# Patient Record
Sex: Female | Born: 1950
Health system: Southern US, Community
[De-identification: ages and names within clinical notes are randomized; demographics above are authoritative.]

## PROBLEM LIST (undated history)

## (undated) DIAGNOSIS — T7840XA Allergy, unspecified, initial encounter: Secondary | ICD-10-CM

## (undated) DIAGNOSIS — Z789 Other specified health status: Secondary | ICD-10-CM

## (undated) DIAGNOSIS — Z8619 Personal history of other infectious and parasitic diseases: Secondary | ICD-10-CM

## (undated) DIAGNOSIS — B019 Varicella without complication: Secondary | ICD-10-CM

## (undated) HISTORY — PX: LASIK: SHX215

## (undated) HISTORY — DX: Other specified health status: Z78.9

## (undated) HISTORY — DX: Allergy, unspecified, initial encounter: T78.40XA

## (undated) HISTORY — PX: TUBAL LIGATION: SHX77

## (undated) HISTORY — DX: Varicella without complication: B01.9

## (undated) HISTORY — PX: AUGMENTATION MAMMAPLASTY: SUR837

## (undated) HISTORY — DX: Personal history of other infectious and parasitic diseases: Z86.19

---

## 2015-07-26 LAB — LIPID PANEL
Cholesterol: 170 (ref 0–200)
HDL: 68 (ref 35–70)
LDL Cholesterol: 90
LDL/HDL RATIO: 1.3
Triglycerides: 58 (ref 40–160)

## 2015-07-26 LAB — CBC AND DIFFERENTIAL
HCT: 44 (ref 36–46)
HEMOGLOBIN: 14.5 (ref 12.0–16.0)
Neutrophils Absolute: 5
PLATELETS: 266 (ref 150–399)
WBC: 8

## 2015-07-26 LAB — BASIC METABOLIC PANEL
BUN: 14 (ref 4–21)
CREATININE: 0.8 (ref 0.5–1.1)
Glucose: 85
POTASSIUM: 4.6 (ref 3.4–5.3)
SODIUM: 142 (ref 137–147)

## 2015-07-26 LAB — HEPATIC FUNCTION PANEL
ALT: 14 (ref 7–35)
AST: 17 (ref 13–35)
Alkaline Phosphatase: 67 (ref 25–125)
BILIRUBIN, TOTAL: 0.3

## 2015-07-26 LAB — VITAMIN D 25 HYDROXY (VIT D DEFICIENCY, FRACTURES): Vit D, 25-Hydroxy: 20.5

## 2017-12-11 ENCOUNTER — Ambulatory Visit (INDEPENDENT_AMBULATORY_CARE_PROVIDER_SITE_OTHER): Payer: PPO | Admitting: Family Medicine

## 2017-12-11 ENCOUNTER — Encounter: Payer: Self-pay | Admitting: Family Medicine

## 2017-12-11 VITALS — BP 149/76 | HR 73 | Temp 98.1°F | Ht 65.0 in | Wt 124.0 lb

## 2017-12-11 DIAGNOSIS — Z7689 Persons encountering health services in other specified circumstances: Secondary | ICD-10-CM | POA: Diagnosis not present

## 2017-12-11 DIAGNOSIS — Z0001 Encounter for general adult medical examination with abnormal findings: Secondary | ICD-10-CM

## 2017-12-11 DIAGNOSIS — Z13220 Encounter for screening for lipoid disorders: Secondary | ICD-10-CM

## 2017-12-11 DIAGNOSIS — Z1211 Encounter for screening for malignant neoplasm of colon: Secondary | ICD-10-CM

## 2017-12-11 DIAGNOSIS — R030 Elevated blood-pressure reading, without diagnosis of hypertension: Secondary | ICD-10-CM | POA: Insufficient documentation

## 2017-12-11 DIAGNOSIS — Z131 Encounter for screening for diabetes mellitus: Secondary | ICD-10-CM

## 2017-12-11 DIAGNOSIS — E2839 Other primary ovarian failure: Secondary | ICD-10-CM

## 2017-12-11 DIAGNOSIS — F17200 Nicotine dependence, unspecified, uncomplicated: Secondary | ICD-10-CM | POA: Diagnosis not present

## 2017-12-11 DIAGNOSIS — Z0000 Encounter for general adult medical examination without abnormal findings: Secondary | ICD-10-CM

## 2017-12-11 DIAGNOSIS — Z1231 Encounter for screening mammogram for malignant neoplasm of breast: Secondary | ICD-10-CM | POA: Diagnosis not present

## 2017-12-11 DIAGNOSIS — Z130 Encounter for screening for diseases of the blood and blood-forming organs and certain disorders involving the immune mechanism: Secondary | ICD-10-CM | POA: Diagnosis not present

## 2017-12-11 DIAGNOSIS — Z1159 Encounter for screening for other viral diseases: Secondary | ICD-10-CM

## 2017-12-11 DIAGNOSIS — Z1239 Encounter for other screening for malignant neoplasm of breast: Secondary | ICD-10-CM

## 2017-12-11 LAB — BASIC METABOLIC PANEL
BUN: 14 mg/dL (ref 6–23)
CHLORIDE: 104 meq/L (ref 96–112)
CO2: 34 meq/L — AB (ref 19–32)
Calcium: 9.5 mg/dL (ref 8.4–10.5)
Creatinine, Ser: 0.8 mg/dL (ref 0.40–1.20)
GFR: 76.25 mL/min (ref >60.00)
Glucose, Bld: 100 mg/dL — ABNORMAL HIGH (ref 70–99)
POTASSIUM: 4.2 meq/L (ref 3.5–5.1)
SODIUM: 142 meq/L (ref 135–145)

## 2017-12-11 LAB — CBC
HCT: 44.9 % (ref 36.0–46.0)
HEMOGLOBIN: 14.8 g/dL (ref 12.0–15.0)
MCHC: 32.9 g/dL (ref 30.0–36.0)
MCV: 95.1 fl (ref 78.0–100.0)
Platelets: 316 10*3/uL (ref 150.0–400.0)
RBC: 4.72 Mil/uL (ref 3.87–5.11)
RDW: 15 % (ref 11.5–15.5)
WBC: 5.4 10*3/uL (ref 4.0–10.5)

## 2017-12-11 LAB — LIPID PANEL
CHOLESTEROL: 166 mg/dL (ref 0–200)
HDL: 77.1 mg/dL (ref >39.00)
LDL Cholesterol: 77 mg/dL (ref 0–99)
NonHDL: 88.45
Total CHOL/HDL Ratio: 2
Triglycerides: 57 mg/dL (ref 0.0–149.0)
VLDL: 11.4 mg/dL (ref 0.0–40.0)

## 2017-12-11 LAB — HEMOGLOBIN A1C: HEMOGLOBIN A1C: 5.9 % (ref 4.6–6.5)

## 2017-12-11 LAB — TSH: TSH: 0.94 u[IU]/mL (ref 0.35–4.50)

## 2017-12-11 NOTE — Patient Instructions (Addendum)
Shingrix, tdap and 2nd pneumonia shot are all due for you. Please call us on the dates you receive your immunization.  Cologuard will be sent to your home and their company will call to arrange.  We will call you with lab results.  Mammogram/bone density center will also call to schedule your screening.    It was a pleasure meeting you!   Health Maintenance, Female Adopting a healthy lifestyle and getting preventive care can go a long way to promote health and wellness. Talk with your health care provider about what schedule of regular examinations is right for you. This is a good chance for you to check in with your provider about disease prevention and staying healthy. In between checkups, there are plenty of things you can do on your own. Experts have done a lot of research about which lifestyle changes and preventive measures are most likely to keep you healthy. Ask your health care provider for more information. Weight and diet Eat a healthy diet  Be sure to include plenty of vegetables, fruits, low-fat dairy products, and lean protein.  Do not eat a lot of foods high in solid fats, added sugars, or salt.  Get regular exercise. This is one of the most important things you can do for your health. ? Most adults should exercise for at least 150 minutes each week. The exercise should increase your heart rate and make you sweat (moderate-intensity exercise). ? Most adults should also do strengthening exercises at least twice a week. This is in addition to the moderate-intensity exercise.  Maintain a healthy weight  Body mass index (BMI) is a measurement that can be used to identify possible weight problems. It estimates body fat based on height and weight. Your health care provider can help determine your BMI and help you achieve or maintain a healthy weight.  For females 51 years of age and older: ? A BMI below 18.5 is considered underweight. ? A BMI of 18.5 to 24.9 is normal. ? A  BMI of 25 to 29.9 is considered overweight. ? A BMI of 30 and above is considered obese.  Watch levels of cholesterol and blood lipids  You should start having your blood tested for lipids and cholesterol at 66 years of age, then have this test every 5 years.  You may need to have your cholesterol levels checked more often if: ? Your lipid or cholesterol levels are high. ? You are older than 66 years of age. ? You are at high risk for heart disease.  Cancer screening Lung Cancer  Lung cancer screening is recommended for adults 67-62 years old who are at high risk for lung cancer because of a history of smoking.  A yearly low-dose CT scan of the lungs is recommended for people who: ? Currently smoke. ? Have quit within the past 15 years. ? Have at least a 30-pack-year history of smoking. A pack year is smoking an average of one pack of cigarettes a day for 1 year.  Yearly screening should continue until it has been 15 years since you quit.  Yearly screening should stop if you develop a health problem that would prevent you from having lung cancer treatment.  Breast Cancer  Practice breast self-awareness. This means understanding how your breasts normally appear and feel.  It also means doing regular breast self-exams. Let your health care provider know about any changes, no matter how small.  If you are in your 20s or 30s, you should have a  clinical breast exam (CBE) by a health care provider every 1-3 years as part of a regular health exam.  If you are 26 or older, have a CBE every year. Also consider having a breast X-ray (mammogram) every year.  If you have a family history of breast cancer, talk to your health care provider about genetic screening.  If you are at high risk for breast cancer, talk to your health care provider about having an MRI and a mammogram every year.  Breast cancer gene (BRCA) assessment is recommended for women who have family members with  BRCA-related cancers. BRCA-related cancers include: ? Breast. ? Ovarian. ? Tubal. ? Peritoneal cancers.  Results of the assessment will determine the need for genetic counseling and BRCA1 and BRCA2 testing.  Cervical Cancer Your health care provider may recommend that you be screened regularly for cancer of the pelvic organs (ovaries, uterus, and vagina). This screening involves a pelvic examination, including checking for microscopic changes to the surface of your cervix (Pap test). You may be encouraged to have this screening done every 3 years, beginning at age 30.  For women ages 20-65, health care providers may recommend pelvic exams and Pap testing every 3 years, or they may recommend the Pap and pelvic exam, combined with testing for human papilloma virus (HPV), every 5 years. Some types of HPV increase your risk of cervical cancer. Testing for HPV may also be done on women of any age with unclear Pap test results.  Other health care providers may not recommend any screening for nonpregnant women who are considered low risk for pelvic cancer and who do not have symptoms. Ask your health care provider if a screening pelvic exam is right for you.  If you have had past treatment for cervical cancer or a condition that could lead to cancer, you need Pap tests and screening for cancer for at least 20 years after your treatment. If Pap tests have been discontinued, your risk factors (such as having a new sexual partner) need to be reassessed to determine if screening should resume. Some women have medical problems that increase the chance of getting cervical cancer. In these cases, your health care provider may recommend more frequent screening and Pap tests.  Colorectal Cancer  This type of cancer can be detected and often prevented.  Routine colorectal cancer screening usually begins at 66 years of age and continues through 66 years of age.  Your health care provider may recommend screening  at an earlier age if you have risk factors for colon cancer.  Your health care provider may also recommend using home test kits to check for hidden blood in the stool.  A small camera at the end of a tube can be used to examine your colon directly (sigmoidoscopy or colonoscopy). This is done to check for the earliest forms of colorectal cancer.  Routine screening usually begins at age 3.  Direct examination of the colon should be repeated every 5-10 years through 66 years of age. However, you may need to be screened more often if early forms of precancerous polyps or small growths are found.  Skin Cancer  Check your skin from head to toe regularly.  Tell your health care provider about any new moles or changes in moles, especially if there is a change in a mole's shape or color.  Also tell your health care provider if you have a mole that is larger than the size of a pencil eraser.  Always use sunscreen.  Apply sunscreen liberally and repeatedly throughout the day.  Protect yourself by wearing long sleeves, pants, a wide-brimmed hat, and sunglasses whenever you are outside.  Heart disease, diabetes, and high blood pressure  High blood pressure causes heart disease and increases the risk of stroke. High blood pressure is more likely to develop in: ? People who have blood pressure in the high end of the normal range (130-139/85-89 mm Hg). ? People who are overweight or obese. ? People who are African American.  If you are 14-33 years of age, have your blood pressure checked every 3-5 years. If you are 62 years of age or older, have your blood pressure checked every year. You should have your blood pressure measured twice-once when you are at a hospital or clinic, and once when you are not at a hospital or clinic. Record the average of the two measurements. To check your blood pressure when you are not at a hospital or clinic, you can use: ? An automated blood pressure machine at a  pharmacy. ? A home blood pressure monitor.  If you are between 3 years and 45 years old, ask your health care provider if you should take aspirin to prevent strokes.  Have regular diabetes screenings. This involves taking a blood sample to check your fasting blood sugar level. ? If you are at a normal weight and have a low risk for diabetes, have this test once every three years after 66 years of age. ? If you are overweight and have a high risk for diabetes, consider being tested at a younger age or more often. Preventing infection Hepatitis B  If you have a higher risk for hepatitis B, you should be screened for this virus. You are considered at high risk for hepatitis B if: ? You were born in a country where hepatitis B is common. Ask your health care provider which countries are considered high risk. ? Your parents were born in a high-risk country, and you have not been immunized against hepatitis B (hepatitis B vaccine). ? You have HIV or AIDS. ? You use needles to inject street drugs. ? You live with someone who has hepatitis B. ? You have had sex with someone who has hepatitis B. ? You get hemodialysis treatment. ? You take certain medicines for conditions, including cancer, organ transplantation, and autoimmune conditions.  Hepatitis C  Blood testing is recommended for: ? Everyone born from [birth year] through 1965. ? Anyone with known risk factors for hepatitis C.  Sexually transmitted infections (STIs)  You should be screened for sexually transmitted infections (STIs) including gonorrhea and chlamydia if: ? You are sexually active and are younger than 66 years of age. ? You are older than 66 years of age and your health care provider tells you that you are at risk for this type of infection. ? Your sexual activity has changed since you were last screened and you are at an increased risk for chlamydia or gonorrhea. Ask your health care provider if you are at risk.  If you do not  have HIV, but are at risk, it may be recommended that you take a prescription medicine daily to prevent HIV infection. This is called pre-exposure prophylaxis (PrEP). You are considered at risk if: ? You are sexually active and do not regularly use condoms or know the HIV status of your partner(s). ? You take drugs by injection. ? You are sexually active with a partner who has HIV.  Talk with your health care  provider about whether you are at high risk of being infected with HIV. If you choose to begin PrEP, you should first be tested for HIV. You should then be tested every 3 months for as long as you are taking PrEP. Pregnancy  If you are premenopausal and you may become pregnant, ask your health care provider about preconception counseling.  If you may become pregnant, take 400 to 800 micrograms (mcg) of folic acid every day.  If you want to prevent pregnancy, talk to your health care provider about birth control (contraception). Osteoporosis and menopause  Osteoporosis is a disease in which the bones lose minerals and strength with aging. This can result in serious bone fractures. Your risk for osteoporosis can be identified using a bone density scan.  If you are 45 years of age or older, or if you are at risk for osteoporosis and fractures, ask your health care provider if you should be screened.  Ask your health care provider whether you should take a calcium or vitamin D supplement to lower your risk for osteoporosis.  Menopause may have certain physical symptoms and risks.  Hormone replacement therapy may reduce some of these symptoms and risks. Talk to your health care provider about whether hormone replacement therapy is right for you. Follow these instructions at home:  Schedule regular health, dental, and eye exams.  Stay current with your immunizations.  Do not use any tobacco products including cigarettes, chewing tobacco, or electronic cigarettes.  If you are pregnant,  do not drink alcohol.  If you are breastfeeding, limit how much and how often you drink alcohol.  Limit alcohol intake to no more than 1 drink per day for nonpregnant women. One drink equals 12 ounces of beer, 5 ounces of wine, or 1 ounces of hard liquor.  Do not use street drugs.  Do not share needles.  Ask your health care provider for help if you need support or information about quitting drugs.  Tell your health care provider if you often feel depressed.  Tell your health care provider if you have ever been abused or do not feel safe at home. This information is not intended to replace advice given to you by your health care provider. Make sure you discuss any questions you have with your health care provider. Document Released: 06/30/2011 Document Revised: 05/22/2016 Document Reviewed: 09/18/2015 Elsevier Interactive Patient Education  [employer].

## 2017-12-11 NOTE — Progress Notes (Signed)
Patient ID: Leah Hansen, female  DOB: 01-Aug-1951, 66 y.o.   MRN: [id number] Patient Care Team    Relationship Specialty Notifications Start End  Ma Hillock, DO PCP - General Family Medicine  12/11/17     Chief Complaint  Patient presents with  . Establish Care    Subjective:  Leah Hansen is a 66 y.o.  female present for new patient establishment. All past medical history, surgical history, allergies, family history, immunizations, medications and social history were updated in the electronic medical record today. All recent labs, ED visits and hospitalizations within the last year were reviewed.  Tobacco use: Patient reports she has smoked approximately 40 years. Initially she smoked about 1 pack per day of pall mall lites. Currently she smokes about half a pack a day and less under great stress and then it can get up near 1 pack per day.  Health maintenance:  Colonoscopy: never screened, no fhx. Declines colonoscopy but is agreeable to cologuard.  Mammogram: completed: Patient reports a normal mammogram in 2005. She is agreeable to have mammogram completed. No breast cancer in the family. Would like to have near [place] or [place] if possible. Cervical cancer screening: N/A > 65 Immunizations: tdap patient reports having immunization completed through [facility] in [place], [place] UTD 2018 (encouraged yearly), PNA series - patient reports having 1 vaccination against pneumonia however she is uncertain which vaccination she will check with [facility] in [place], she will think about the shingrix vaccine and get through work Geneticist, molecular) Infectious disease screening:  Hep C agreeable to testing will try to add on labs.  DEXA: Never screened. Will complete with mammogram.  Assistive device: None Oxygen NWG:NFAO Patient has a Dental home. Hospitalizations/ED visits: reviewed if available. No prior PCP records available at time of visit.   Depression  screen PHQ 2/9 12/11/2017  Decreased Interest 0  Down, Depressed, Hopeless 0  PHQ - 2 Score 0   No flowsheet data found.    Current Exercise Habits: Home exercise routine, Type of exercise: strength training/weights, Time (Minutes): > 60(90), Frequency (Times/Week): 4, Weekly Exercise (Minutes/Week): 0, Intensity: Moderate   Fall Risk  12/11/2017  Falls in the past year? No  Risk for fall due to : Other (Comment)     Immunization History  Administered Date(s) Administered  . Influenza-Unspecified 11/03/2017    No exam data present  Past Medical History:  Diagnosis Date  . Allergy   . Chicken pox    Not on File Past Surgical History:  Procedure Laterality Date  . BREAST SURGERY     augmentation  . TUBAL LIGATION     Family History  Problem Relation Age of Onset  . Asthma Mother   . COPD Mother   . Hearing loss Mother   . Hyperlipidemia Mother   . Alcohol abuse Father   . Arthritis Father   . Early death Father   . Heart disease Father   . Hyperlipidemia Father   . Hypertension Father   . Alcohol abuse Brother   . Lung cancer Brother   . Depression Brother   . Drug abuse Brother   . Early death Brother   . Arthritis Paternal Grandmother   . Lymphoma Paternal Grandmother   . COPD Paternal Grandfather   . Alcohol abuse Brother   . Drug abuse Brother   . Early death Brother   . Alcohol abuse Son   . COPD Son   . Drug abuse Son  Social History   Socioeconomic History  . Marital status: Divorced    Spouse name: Not on file  . Number of children: 3  . Years of education: Not on file  . Highest education level: Not on file  Social Needs  . Financial resource strain: Not on file  . Food insecurity - worry: Not on file  . Food insecurity - inability: Not on file  . Transportation needs - medical: Not on file  . Transportation needs - non-medical: Not on file  Occupational History  . Occupation: Occupational psychologist  Tobacco Use  . Smoking status:  Current Every Day Smoker    Packs/day: 1.00    Years: 40.00    Pack years: 40.00    Types: Cigarettes  . Smokeless tobacco: Never Used  Substance and Sexual Activity  . Alcohol use: No    Frequency: Never  . Drug use: No  . Sexual activity: No  Other Topics Concern  . Not on file  Social History Narrative   Divorced, college educated, 3 children.   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   Current smoker.   Exercises routinely.   Takes a daily vitamin.   Drinks caffeine.   Has a smoke alarm in the home, wears her seatbelt.   Wears dentures.   Feels safe in her relationships.   Allergies as of 12/11/2017   Not on File     Medication List        Accurate as of 12/11/17  5:43 PM. Always use your most recent med list.          CALCIUM 600+D PLUS MINERALS 600-400 MG-UNIT Tabs Take 1 tablet by mouth daily.   CoQ-10 100 MG Caps Take 1 g by mouth daily.   LUTEIN PO Take 1 capsule by mouth daily.   selenium 50 MCG Tabs tablet Take 50 mcg by mouth daily.   TURMERIC PO Take 2 tablets by mouth daily.   vitamin C 1000 MG tablet Take 1,000 mg by mouth daily.       All past medical history, surgical history, allergies, family history, immunizations andmedications were updated in the EMR today and reviewed under the history and medication portions of their EMR.    Recent Results (from the past 2160 hour(s))  CBC     Status: None   Collection Time: 12/11/17 10:43 AM  Result Value Ref Range   WBC 5.4 4.0 - 10.5 K/uL   RBC 4.72 3.87 - 5.11 Mil/uL   Platelets 316.0 150.0 - 400.0 K/uL   Hemoglobin 14.8 12.0 - 15.0 g/dL   HCT 44.9 36.0 - 46.0 %   MCV 95.1 78.0 - 100.0 fl   MCHC 32.9 30.0 - 36.0 g/dL   RDW 15.0 11.5 - 87.5 %  Basic Metabolic Panel (BMET)     Status: Abnormal   Collection Time: 12/11/17 10:43 AM  Result Value Ref Range   Sodium 142 135 - 145 mEq/L   Potassium 4.2 3.5 - 5.1 mEq/L   Chloride 104 96 - 112 mEq/L   CO2 34 (H) 19 - 32  mEq/L   Glucose, Bld 100 (H) 70 - 99 mg/dL   BUN 14 6 - 23 mg/dL   Creatinine, Ser 0.80 0.40 - 1.20 mg/dL   Calcium 9.5 8.4 - 10.5 mg/dL   GFR 76.25 >60.00 mL/min  TSH     Status: None   Collection Time: 12/11/17 10:43 AM  Result Value Ref Range   TSH  0.94 0.35 - 4.50 uIU/mL  HgB A1c     Status: None   Collection Time: 12/11/17 10:43 AM  Result Value Ref Range   Hgb A1c MFr Bld 5.9 4.6 - 6.5 %    Comment: Glycemic Control Guidelines for People with Diabetes:Non Diabetic:  <6%Goal of Therapy: <7%Additional Action Suggested:  >8%   Lipid panel     Status: None   Collection Time: 12/11/17 10:43 AM  Result Value Ref Range   Cholesterol 166 0 - 200 mg/dL    Comment: ATP III Classification       Desirable:  < 200 mg/dL               Borderline High:  200 - 239 mg/dL          High:  > = 240 mg/dL   Triglycerides 57.0 0.0 - 149.0 mg/dL    Comment: Normal:  <150 mg/dLBorderline High:  150 - 199 mg/dL   HDL 77.10 >39.00 mg/dL   VLDL 11.4 0.0 - 40.0 mg/dL   LDL Cholesterol 77 0 - 99 mg/dL   Total CHOL/HDL Ratio 2     Comment:                Men          Women1/2 Average Risk     3.4          3.3Average Risk          5.0          4.42X Average Risk          9.6          7.13X Average Risk          15.0          11.0                       NonHDL 88.45     Comment: NOTE:  Non-HDL goal should be 30 mg/dL higher than patient's LDL goal (i.e. LDL goal of < 70 mg/dL, would have non-HDL goal of < 100 mg/dL)    Patient was never admitted.   ROS: 14 pt review of systems performed and negative (unless mentioned in an HPI)  Objective: BP (!) 149/76 (BP Location: Left Arm, Patient Position: Sitting, Cuff Size: Normal)   Pulse 73   Temp 98.1 F (36.7 C)   Ht 5\' 5"  (1.651 m)   Wt 124 lb (56.2 kg)   SpO2 97%   BMI 20.63 kg/m  Gen: Afebrile. No acute distress. Nontoxic in appearance, well-developed, well-nourished,  thin Caucasian female. HENT: AT. [place]. Bilateral TM visualized and normal in  appearance, normal external auditory canal. MMM, no oral lesions, adequate dentition. Bilateral nares within normal limits. Throat without erythema, ulcerations or exudates. no Cough on exam, no hoarseness on exam. Eyes:Pupils Equal Round Reactive to light, Extraocular movements intact,  Conjunctiva without redness, discharge or icterus. Neck/lymp/endocrine: Supple, no lymphadenopathy, no thyromegaly CV: RRR no murmur, no edema, +2/4 P posterior tibialis pulses. No carotid bruits. No JVD. Chest: CTAB, no wheeze, rhonchi or crackles. Normal Respiratory effort. Good Air movement. Abd: Soft. Flat. NTND. BS present. No Masses palpated. No hepatosplenomegaly. No rebound tenderness or guarding. Skin: No rashes, purpura or petechiae. Warm and well-perfused. Skin intact. Neuro/Msk:  Normal gait. PERLA. EOMi. Alert. Oriented x3.  Cranial nerves II through XII intact. Muscle strength 5/5 upper/lower extremity. DTRs equal bilaterally. Psych: Normal affect, dress and demeanor. Normal speech.  Normal thought content and judgment.   Assessment/plan: MILICA GULLY is a 66 y.o. female present for establish care. Elevated blood pressure reading - Discussed blood pressure elevation with patient today. She is nervous being she is establishing with a physician and she hasn't really seen a physician routinely in many years. She works at [employer], I have asked her to monitor her blood pressure over the next few weeks, if greater than 140 routinely on the top and we would need to see her back sooner. - Low-sodium diet. Increase exercise. - CBC - Basic Metabolic Panel (BMET) - TSH - HgB A1c - Lipid panel  Screening cholesterol level - Lipid panel Diabetes mellitus screening - HgB A1c Screening for iron deficiency anemia - CBC Need for hepatitis C screening test - Hepatitis C Antibody Estrogen deficiency - complete with mammogram - DG Bone Density; Future Breast cancer screening - complete with DEXA -  MM DIGITAL SCREENING BILATERAL; Future Colon cancer screening Cologuard ordered. Adamantly declines colonoscopy unless positive Colocort or issue. Tobacco use disorder Briefly discussed low-dose CT lung cancer screening with patient today. She has not been established with a primary care in a very long time, and has agreed to many screenings already today. Do not want to overwhelm her, and she appreciates this. She will think about the low-dose CT and is agreeable will make an appointment to discuss or we can order at next years physical. - Patient was encouraged to stop smoking, if she needs assistance with this or interested in prescribed medications she will make an appointment to be seen.  Encounter for preventative adult health care examination/establish care Patient was encouraged to exercise greater than 150 minutes a week. Patient was encouraged to choose a diet filled with fresh fruits and vegetables, and Leah meats. AVS provided to patient today for education/recommendation on gender specific health and safety maintenance. Immunizations: Patient reports receiving tetanus and a pneumonia vaccination through her work at [employer]. She will check on the dates for these and report back to [place]. Discussed with her she should have the second pneumonia vaccination in the series, however I want to see when in what vaccination for pneumonia she has artery received. Encourage her to have the shingles vaccination through her employment if available.Flu shot up-to-date 2018. Mammogram in bone density screening ordered. Cologuard ordered. Hep C screen ordered.   Follow-up one year for CPE and less labs and imaging indicate she needs to be seen her for follow-up.  Note is dictated utilizing voice recognition software. Although note has been proof read prior to signing, occasional typographical errors still can be missed. If any questions arise, please do not hesitate to call for verification.    Electronically signed by: Howard Pouch, DO [place]

## 2017-12-14 ENCOUNTER — Telehealth: Payer: Self-pay | Admitting: Family Medicine

## 2017-12-14 ENCOUNTER — Other Ambulatory Visit: Payer: PPO

## 2017-12-14 ENCOUNTER — Encounter: Payer: Self-pay | Admitting: *Deleted

## 2017-12-14 DIAGNOSIS — R7309 Other abnormal glucose: Secondary | ICD-10-CM | POA: Insufficient documentation

## 2017-12-14 DIAGNOSIS — Z1159 Encounter for screening for other viral diseases: Secondary | ICD-10-CM | POA: Diagnosis not present

## 2017-12-14 NOTE — Telephone Encounter (Signed)
Left detailed message with results and instructions on patient voice mail and sent in My Chart.

## 2017-12-14 NOTE — Addendum Note (Signed)
Addended by: Leota Jacobsen on: 12/14/2017 08:04 AM   Modules accepted: Orders

## 2017-12-14 NOTE — Telephone Encounter (Signed)
Please call pt: - Her labs all look good. Her cholesterol looked great. Thyroid functioning normal. - Her diabetes screen is just very mildly elevated in the prediabetic range at 5.9, but her fasting glucose was good. Given this I would encourage her to attempt to lower sugar diet and make sure to exercise greater than 150 minutes a week.  -Her blood pressure was mildly elevated during her exam. 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 If her blood pressure is greater than 140 on the top number or 90 on the bottom lumbar routinely, then we would need to see her to discuss blood pressure medication.  - Follow-up in 6 months- provider appointment for repeat blood pressure check in the office and repeat A1c for elevated A1c.

## 2017-12-15 LAB — HEPATITIS C ANTIBODY
Hepatitis C Ab: NONREACTIVE
SIGNAL TO CUT-OFF: 0.01 (ref <1.00)

## 2017-12-30 ENCOUNTER — Ambulatory Visit (INDEPENDENT_AMBULATORY_CARE_PROVIDER_SITE_OTHER): Payer: PPO

## 2017-12-30 ENCOUNTER — Other Ambulatory Visit: Payer: Self-pay | Admitting: Family Medicine

## 2017-12-30 DIAGNOSIS — M85852 Other specified disorders of bone density and structure, left thigh: Secondary | ICD-10-CM

## 2017-12-30 DIAGNOSIS — Z1231 Encounter for screening mammogram for malignant neoplasm of breast: Secondary | ICD-10-CM

## 2017-12-30 DIAGNOSIS — Z1239 Encounter for other screening for malignant neoplasm of breast: Secondary | ICD-10-CM

## 2017-12-30 DIAGNOSIS — E2839 Other primary ovarian failure: Secondary | ICD-10-CM

## 2017-12-30 DIAGNOSIS — Z1212 Encounter for screening for malignant neoplasm of rectum: Secondary | ICD-10-CM | POA: Diagnosis not present

## 2017-12-30 DIAGNOSIS — Z1211 Encounter for screening for malignant neoplasm of colon: Secondary | ICD-10-CM | POA: Diagnosis not present

## 2017-12-30 DIAGNOSIS — Z780 Asymptomatic menopausal state: Secondary | ICD-10-CM | POA: Diagnosis not present

## 2018-01-01 LAB — COLOGUARD: Cologuard: POSITIVE

## 2018-01-07 ENCOUNTER — Encounter: Payer: Self-pay | Admitting: *Deleted

## 2018-01-07 ENCOUNTER — Telehealth: Payer: Self-pay | Admitting: *Deleted

## 2018-01-07 NOTE — Telephone Encounter (Signed)
Results of cologuard done 12/31/17 came back with positive result. Please advise.

## 2018-01-07 NOTE — Telephone Encounter (Signed)
Left message for patient to return call.

## 2018-01-07 NOTE — Telephone Encounter (Signed)
Please call pt and inform her the Cologuard test is positive.  A positive result does not necessarily mean that she has cancer. It means that Cologuard detected DNA and/or  biomarkers in the stool which are associated with colon cancer or precancer. Patients with a positive result should have a diagnostic colonoscopy. Not investigating further now, could result in missed opportunity to diagnose and treat colon cancer or precancer. She adamantly refused colonoscopy referral during her preventive, unless her Cologuard was positive. I am recommending she have a colonoscopy now that it is positive. I will place a referral to the gastroenterologist if she is agreeable to see them.

## 2018-01-08 ENCOUNTER — Encounter: Payer: Self-pay | Admitting: *Deleted

## 2018-01-08 DIAGNOSIS — R195 Other fecal abnormalities: Secondary | ICD-10-CM | POA: Insufficient documentation

## 2018-01-08 NOTE — Telephone Encounter (Signed)
Sent information in [facility] Chart as requested by patient.

## 2018-01-08 NOTE — Telephone Encounter (Signed)
Patient sent My Chart message she is willing to see GI

## 2018-01-15 ENCOUNTER — Encounter: Payer: Self-pay | Admitting: Internal Medicine

## 2018-01-18 ENCOUNTER — Encounter: Payer: Self-pay | Admitting: Family Medicine

## 2018-01-21 ENCOUNTER — Encounter: Payer: Self-pay | Admitting: Family Medicine

## 2018-01-21 ENCOUNTER — Encounter: Payer: Self-pay | Admitting: *Deleted

## 2018-01-21 DIAGNOSIS — E559 Vitamin D deficiency, unspecified: Secondary | ICD-10-CM | POA: Insufficient documentation

## 2018-03-11 ENCOUNTER — Ambulatory Visit (AMBULATORY_SURGERY_CENTER): Payer: Self-pay

## 2018-03-11 ENCOUNTER — Encounter: Payer: Self-pay | Admitting: Internal Medicine

## 2018-03-11 VITALS — Ht 65.0 in | Wt 125.4 lb

## 2018-03-11 DIAGNOSIS — R195 Other fecal abnormalities: Secondary | ICD-10-CM

## 2018-03-11 MED ORDER — NA SULFATE-K SULFATE-MG SULF 17.5-3.13-1.6 GM/177ML PO SOLN: 1.0000 | Freq: Once | ORAL | 0 refills | Status: AC

## 2018-03-11 NOTE — Progress Notes (Signed)
Per pt, no allergies to soy or egg products.Pt not taking any weight loss meds or using  O2 at home.  Pt refused emmi video. 

## 2018-03-25 ENCOUNTER — Ambulatory Visit (AMBULATORY_SURGERY_CENTER): Payer: BLUE CROSS/BLUE SHIELD | Admitting: Internal Medicine

## 2018-03-25 ENCOUNTER — Other Ambulatory Visit: Payer: Self-pay

## 2018-03-25 ENCOUNTER — Encounter: Payer: Self-pay | Admitting: Internal Medicine

## 2018-03-25 VITALS — BP 102/66 | HR 71 | Temp 98.0°F | Resp 17 | Ht 65.0 in | Wt 125.0 lb

## 2018-03-25 DIAGNOSIS — D122 Benign neoplasm of ascending colon: Secondary | ICD-10-CM

## 2018-03-25 DIAGNOSIS — Z1211 Encounter for screening for malignant neoplasm of colon: Secondary | ICD-10-CM | POA: Diagnosis not present

## 2018-03-25 DIAGNOSIS — R195 Other fecal abnormalities: Secondary | ICD-10-CM

## 2018-03-25 DIAGNOSIS — D120 Benign neoplasm of cecum: Secondary | ICD-10-CM | POA: Diagnosis not present

## 2018-03-25 DIAGNOSIS — D124 Benign neoplasm of descending colon: Secondary | ICD-10-CM

## 2018-03-25 MED ORDER — SODIUM CHLORIDE 0.9 % IV SOLN
500.0000 mL | Freq: Once | INTRAVENOUS | Status: AC
Start: 2018-03-25 — End: ?

## 2018-03-25 NOTE — Progress Notes (Signed)
Report to RN, VSS, adequate respirations noted, no c/o pain or discomfort 

## 2018-03-25 NOTE — Op Note (Signed)
[facility] Patient Name: Leah Hansen Procedure Date: 03/25/2018 9:20 AM MRN: [id number] Endoscopist: Jerene Bears , MD Age: 67 Referring MD:  Date of Birth: 28-May-1951 Gender: Female Account #: [id number] Procedure:                Colonoscopy Indications:              Positive Cologuard test Medicines:                Monitored Anesthesia Care Procedure:                Pre-Anesthesia Assessment:                           - Prior to the procedure, a History and Physical                            was performed, and patient medications and                            allergies were reviewed. The patient's tolerance of                            previous anesthesia was also reviewed. The risks                            and benefits of the procedure and the sedation                            options and risks were discussed with the patient.                            All questions were answered, and informed consent                            was obtained. Prior Anticoagulants: The patient has                            taken no previous anticoagulant or antiplatelet                            agents. ASA Grade Assessment: II - A patient with                            mild systemic disease. After reviewing the risks                            and benefits, the patient was deemed in                            satisfactory condition to undergo the procedure.                           After obtaining informed consent, the colonoscope  was passed under direct vision. Throughout the                            procedure, the patient's blood pressure, pulse, and                            oxygen saturations were monitored continuously. The                            Model PCF-[id number]) scope was introduced                            through the anus and advanced to the the cecum,                            identified by appendiceal orifice  and ileocecal                            valve. The colonoscopy was performed without                            difficulty. The patient tolerated the procedure                            well. The quality of the bowel preparation was                            good. The ileocecal valve, appendiceal orifice, and                            rectum were photographed. Scope In: 9:26:45 AM Scope Out: 9:51:05 AM Scope Withdrawal Time: 0 hours 19 minutes 0 seconds  Total Procedure Duration: 0 hours 24 minutes 20 seconds  Findings:                 The digital rectal exam was normal.                           A 20 mm polyp was found in the cecum. The polyp was                            sessile. The polyp was removed with a piecemeal                            technique using a cold snare. Resection and                            retrieval were complete.                           Two sessile polyps were found in the ascending                            colon. The polyps were 5 to 7 mm in  size. These                            polyps were removed with a cold snare. Resection                            and retrieval were complete.                           A 5 mm polyp was found in the descending colon. The                            polyp was sessile. The polyp was removed with a                            cold snare. Resection and retrieval were complete.                           The retroflexed view of the distal rectum and anal                            verge was normal and showed no anal or rectal                            abnormalities. Complications:            No immediate complications. Estimated Blood Loss:     Estimated blood loss was minimal. Impression:               - One 20 mm polyp in the cecum, removed piecemeal                            using a cold snare. Resected and retrieved.                           - Two 5 to 7 mm polyps in the ascending colon,                             removed with a cold snare. Resected and retrieved.                           - One 5 mm polyp in the descending colon, removed                            with a cold snare. Resected and retrieved.                           - The distal rectum and anal verge are normal on                            retroflexion view. Recommendation:           - Patient has a contact number available for  emergencies. The signs and symptoms of potential                            delayed complications were discussed with the                            patient. Return to normal activities tomorrow.                            Written discharge instructions were provided to the                            patient.                           - Resume previous diet.                           - Continue present medications.                           - Await pathology results.                           - Repeat colonoscopy in 3 years for surveillance. Jerene Bears, MD 03/25/2018 10:04:49 AM This report has been signed electronically.

## 2018-03-25 NOTE — Progress Notes (Signed)
Called to room to assist during endoscopic procedure.  Patient ID and intended procedure confirmed with present staff. Received instructions for my participation in the procedure from the performing physician.  

## 2018-03-25 NOTE — Patient Instructions (Signed)
Information given on polyps.  YOU HAD AN ENDOSCOPIC PROCEDURE TODAY AT [facility]:   Refer to the procedure report that was given to you for any specific questions about what was found during the examination.  If the procedure report does not answer your questions, please call your gastroenterologist to clarify.  If you requested that your care partner not be given the details of your procedure findings, then the procedure report has been included in a sealed envelope for you to review at your convenience later.  YOU SHOULD EXPECT: Some feelings of bloating in the abdomen. Passage of more gas than usual.  Walking can help get rid of the air that was put into your GI tract during the procedure and reduce the bloating. If you had a lower endoscopy (such as a colonoscopy or flexible sigmoidoscopy) you may notice spotting of blood in your stool or on the toilet paper. If you underwent a bowel prep for your procedure, you may not have a normal bowel movement for a few days.  Please Note:  You might notice some irritation and congestion in your nose or some drainage.  This is from the oxygen used during your procedure.  There is no need for concern and it should clear up in a day or so.  SYMPTOMS TO REPORT IMMEDIATELY:   Following lower endoscopy (colonoscopy or flexible sigmoidoscopy):  Excessive amounts of blood in the stool  Significant tenderness or worsening of abdominal pains  Swelling of the abdomen that is new, acute  Fever of 100F or higher For urgent or emergent issues, a gastroenterologist can be reached at any hour by calling [phone].   DIET:  We do recommend a small meal at first, but then you may proceed to your regular diet.  Drink plenty of fluids but you should avoid alcoholic beverages for 24 hours.  ACTIVITY:  You should plan to take it easy for the rest of today and you should NOT DRIVE or use heavy machinery until tomorrow (because of the sedation  medicines used during the test).    FOLLOW UP: Our staff will call the number listed on your records the next business day following your procedure to check on you and address any questions or concerns that you may have regarding the information given to you following your procedure. If we do not reach you, we will leave a message.  However, if you are feeling well and you are not experiencing any problems, there is no need to return our call.  We will assume that you have returned to your regular daily activities without incident.  If any biopsies were taken you will be contacted by phone or by letter within the next 1-3 weeks.  Please call us at [phone] if you have not heard about the biopsies in 3 weeks.    SIGNATURES/CONFIDENTIALITY: You and/or your care partner have signed paperwork which will be entered into your electronic medical record.  These signatures attest to the fact that that the information above on your After Visit Summary has been reviewed and is understood.  Full responsibility of the confidentiality of this discharge information lies with you and/or your care-partner.

## 2018-03-26 ENCOUNTER — Telehealth: Payer: Self-pay | Admitting: *Deleted

## 2018-03-26 NOTE — Telephone Encounter (Signed)
  Follow up Call-  Call back number 03/25/2018  Post procedure Call Back phone  # [phone]  Permission to leave phone message Yes     Patient questions:  Do you have a fever, pain , or abdominal swelling? No. Pain Score  0 *  Have you tolerated food without any problems? Yes.    Have you been able to return to your normal activities? Yes.    Do you have any questions about your discharge instructions: Diet   No. Medications  No. Follow up visit  No.  Do you have questions or concerns about your Care? No.  Actions: * If pain score is 4 or above: No action needed, pain <4.

## 2018-03-30 ENCOUNTER — Encounter: Payer: Self-pay | Admitting: Internal Medicine

## 2018-04-09 ENCOUNTER — Encounter: Payer: Self-pay | Admitting: *Deleted

## 2018-09-22 ENCOUNTER — Encounter: Payer: Self-pay | Admitting: Family Medicine

## 2021-08-11 ENCOUNTER — Encounter: Payer: Self-pay | Admitting: Internal Medicine

## 2024-11-16 ENCOUNTER — Ambulatory Visit: Payer: Self-pay

## 2024-11-16 NOTE — Telephone Encounter (Signed)
 FYI Only or Action Required?: FYI only for provider: appointment scheduled on New Patient appt scheduled for 01/03/25.  Patient was last seen in primary care on New Patient.  Called Nurse Triage reporting Sinusitis.  Symptoms began several weeks ago.  Interventions attempted: OTC medications: Mucinex.  Symptoms are: unchanged.  Triage Disposition: See HCP Within 4 Hours (Or PCP Triage)  Patient/caregiver understands and will follow disposition?: Yes         Copied from CRM [id number]. Topic: Clinical - Red Word Triage >> Nov 16, 2024  4:04 PM Alexandria E wrote: [facility] that prompted transfer to Nurse Triage: Head and chest congestion going on since middle of October, doxycycline did not help. Reason for Disposition  [1] MILD difficulty breathing (e.g., minimal/no SOB at rest, SOB with walking, pulse < 100) AND [2] still present when not coughing  Answer Assessment - Initial Assessment Questions 1. LOCATION: Where does it hurt?        2. ONSET: When did the sinus pain start?  (e.g., hours, days)        3. SEVERITY: How bad is the pain?   (Scale 0-10; or none, mild, moderate or severe)       4. RECURRENT SYMPTOM: Have you ever had sinus problems before? If Yes, ask: When was the last time? and What happened that time?        5. NASAL CONGESTION: Is the nose blocked? If Yes, ask: Can you open it or must you breathe through your mouth?       6. NASAL DISCHARGE: Do you have discharge from your nose? If so ask, What color?       7. FEVER: Do you have a fever? If Yes, ask: What is it, how was it measured, and when did it start?        8. OTHER SYMPTOMS: Do you have any other symptoms? (e.g., sore throat, cough, earache, difficulty breathing)  Answer Assessment - Initial Assessment Questions 1. ONSET: When did the cough begin?      Ongoing since October   2. SEVERITY: How bad is the cough today?      Intermittent   3. SPUTUM: Describe  the color of your sputum (e.g., none, dry cough; clear, white, yellow, green)     Beige   4. HEMOPTYSIS: Are you coughing up any blood? If Yes, ask: How much? (e.g., flecks, streaks, tablespoons, etc.)     No   5. DIFFICULTY BREATHING: Are you having difficulty breathing? If Yes, ask: How bad is it? (e.g., mild, moderate, severe)       SOB intermittently after the cough or with exertion, when sleeping she feels out of breathe   6. FEVER: Do you have a fever? If Yes, ask: What is your temperature, how was it measured, and when did it start?     No   7. CARDIAC HISTORY: Do you have any history of heart disease? (e.g., heart attack, congestive heart failure)      No   8. LUNG HISTORY: Do you have any history of lung disease?  (e.g., pulmonary embolus, asthma, emphysema)     No   9. PE RISK FACTORS: Do you have a history of blood clots? (or: recent major surgery, recent prolonged travel, bedridden)     No   10. OTHER SYMPTOMS: Do you have any other symptoms? (e.g., runny nose, wheezing, chest pain) No   Head and chest congestion going on since middle of October, doxycycline did not help  when being seen in UC in October. For home care she is taking Mucinex.  Protocols used: Sinus Pain or Congestion-A-AH, Cough - Acute Productive-A-AH

## 2024-11-17 ENCOUNTER — Ambulatory Visit

## 2024-11-17 ENCOUNTER — Other Ambulatory Visit: Payer: Self-pay

## 2024-11-17 ENCOUNTER — Ambulatory Visit
Admission: RE | Admit: 2024-11-17 | Discharge: 2024-11-17 | Disposition: A | Discharge status: Home / Self Care | Attending: Family Medicine | Admitting: Family Medicine

## 2024-11-17 VITALS — BP 146/81 | HR 72 | Temp 97.6°F | Resp 16

## 2024-11-17 DIAGNOSIS — R059 Cough, unspecified: Secondary | ICD-10-CM

## 2024-11-17 DIAGNOSIS — R5383 Other fatigue: Secondary | ICD-10-CM | POA: Diagnosis not present

## 2024-11-17 DIAGNOSIS — J069 Acute upper respiratory infection, unspecified: Secondary | ICD-10-CM

## 2024-11-17 MED ORDER — PREDNISONE 10 MG (21) PO TBPK: ORAL_TABLET | Freq: Every day | ORAL | 0 refills | Status: DC

## 2024-11-17 MED ORDER — AMOXICILLIN-POT CLAVULANATE 875-125 MG PO TABS: 1.0000 | ORAL_TABLET | Freq: Two times a day (BID) | ORAL | 0 refills | Status: DC

## 2024-11-17 NOTE — ED Triage Notes (Signed)
 Sick since October. First of October went to urgent care and had doxycycline. Reports this did not clear up her congestion, sob. Has had head congestion which started 3-4 days ago. Has not felt feverish but has not checked.

## 2024-11-17 NOTE — ED Provider Notes (Signed)
 [facility]    CSN: [id number] Arrival date & time: 11/17/24  1620      History   Chief Complaint Chief Complaint  Patient presents with   Cough    Cough and congestion with shortness of breath - Entered by patient   Shortness of Breath    HPI Leah Hansen is a 73 y.o. female.   HPI 73 year old female presents with cough and shortness of breath since 1 October.  PMH significant for tobacco use disorder, elevated blood pressure reading, and vitamin D  deficiency.  Patient is accompanied by her daughter this evening.  Patient was evaluated and treated for URI on 09/30/2024 prescribed doxycycline.  Please see epic for that encounter note.  Past Medical History:  Diagnosis Date   Allergy    Chicken pox    History of German measles    No blood products    per pt/    Patient Active Problem List   Diagnosis Date Noted   Vitamin D  deficiency 01/21/2018   Positive colorectal cancer screening using Cologuard test 01/08/2018   Elevated hemoglobin A1c 12/14/2017   Tobacco use disorder 12/11/2017   Estrogen deficiency 12/11/2017   Elevated blood pressure reading 12/11/2017    Past Surgical History:  Procedure Laterality Date   AUGMENTATION MAMMAPLASTY Bilateral    saline, behind muscle    LASIK     TUBAL LIGATION      OB History     Gravida  3   Para  3   Term      Preterm      AB      Living  3      SAB      IAB      Ectopic      Multiple      Live Births               Home Medications    Prior to Admission medications   Medication Sig Start Date End Date Taking? Authorizing Provider  amoxicillin-clavulanate (AUGMENTIN) 875-125 MG tablet Take 1 tablet by mouth every 12 (twelve) hours. 11/17/24  Yes Teddy Sharper, FNP  predniSONE (STERAPRED UNI-PAK 21 TAB) 10 MG (21) TBPK tablet Take by mouth daily. Take 6 tabs by mouth daily  for 2 days, then 5 tabs for 2 days, then 4 tabs for 2 days, then 3 tabs for 2 days, 2 tabs for 2  days, then 1 tab by mouth daily for 2 days 11/17/24  Yes Teddy Sharper, FNP  Ascorbic Acid (VITAMIN C) 1000 MG tablet Take 1,000 mg by mouth daily.    [provider]  Calcium Carbonate-Vit D-Min (CALCIUM 600+D PLUS MINERALS) 600-400 MG-UNIT TABS Take 1 tablet by mouth daily.    [provider]  Coenzyme Q10 (COQ-10) 100 MG CAPS Take 200 mg by mouth daily.     [provider]  Glucosamine-Chondroitin 250-200 MG TABS Take 1,000 mg by mouth. Take 2 daily    [provider]  LUTEIN PO Take 1 capsule by mouth daily.    [provider]  NON FORMULARY Protein Drink 60 gm-Drink one daily    [provider]  selenium 50 MCG TABS tablet Take 100 mcg by mouth daily.     [provider]  TURMERIC PO Take 2 tablets by mouth daily.    [provider]    Family History Family History  Problem Relation Age of Onset   Asthma Mother    COPD Mother  Hearing loss Mother    Hyperlipidemia Mother    Hypertension Mother    Alcohol abuse Father    Arthritis Father    Early death Father    Heart disease Father    Hyperlipidemia Father    Hypertension Father    Alcohol abuse Brother    Lung cancer Brother    Depression Brother    Drug abuse Brother    Early death Brother    Arthritis Paternal Grandmother    Lymphoma Paternal Grandmother    COPD Paternal Grandfather    Alcohol abuse Brother    Drug abuse Brother    Early death Brother    Alcohol abuse Son    COPD Son    Drug abuse Son    Colon cancer Neg Hx     Social History Social History   Tobacco Use   Smoking status: Every Day    Current packs/day: 0.75    Average packs/day: 0.8 packs/day for 40.0 years (30.0 ttl pk-yrs)    Types: Cigarettes   Smokeless tobacco: Never  Vaping Use   Vaping status: Never Used  Substance Use Topics   Alcohol use: No   Drug use: No     Allergies   Patient has no known allergies.   Review of Systems Review of Systems   HENT:  Positive for congestion.   Respiratory:  Positive for cough.   All other systems reviewed and are negative.    Physical Exam Triage Vital Signs ED Triage Vitals  Encounter Vitals Group     BP      Girls Systolic BP Percentile      Girls Diastolic BP Percentile      Boys Systolic BP Percentile      Boys Diastolic BP Percentile      Pulse      Resp      Temp      Temp src      SpO2      Weight      Height      Head Circumference      Peak Flow      Pain Score      Pain Loc      Pain Education      Exclude from Growth Chart    No data found.  Updated Vital Signs BP (!) 146/81   Pulse 72   Temp 97.6 F (36.4 C)   Resp 16   SpO2 97%   Visual Acuity Right Eye Distance:   Left Eye Distance:   Bilateral Distance:    Right Eye Near:   Left Eye Near:    Bilateral Near:     Physical Exam Vitals and nursing note reviewed.  Constitutional:      Appearance: Normal appearance. She is normal weight. She is ill-appearing.  HENT:     Head: Normocephalic and atraumatic.     Right Ear: Tympanic membrane, ear canal and external ear normal.     Left Ear: Tympanic membrane, ear canal and external ear normal.     Mouth/Throat:     Mouth: Mucous membranes are moist.     Pharynx: Oropharynx is clear.  Eyes:     Extraocular Movements: Extraocular movements intact.     Conjunctiva/sclera: Conjunctivae normal.     Pupils: Pupils are equal, round, and reactive to light.  Cardiovascular:     Rate and Rhythm: Normal rate and regular rhythm.     Heart sounds: Normal heart sounds.  Pulmonary:  Effort: Pulmonary effort is normal.     Breath sounds: No wheezing, rhonchi or rales.     Comments: Diminished breath sounds throughout, infrequent nonproductive cough noted Musculoskeletal:        General: Normal range of motion.  Skin:    General: Skin is warm and dry.  Neurological:     General: No focal deficit present.     Mental Status: She is alert and oriented to  person, place, and time. Mental status is at baseline.  Psychiatric:        Mood and Affect: Mood normal.      UC Treatments / Results  Labs (all labs ordered are listed, but only abnormal results are displayed) Labs Reviewed - No data to display  EKG   Radiology DG Chest 2 View Result Date: 11/17/2024 CLINICAL DATA:  Cough and fatigue. EXAM: DG CHEST 2V COMPARISON:  None Available. FINDINGS: No focal consolidation, pleural effusion or pneumothorax. The cardiac silhouette is within limits. No acute osseous pathology. Scoliosis. Bilateral breast implants. IMPRESSION: No active cardiopulmonary disease. Electronically Signed   By: Vanetta Chou M.D.   On: 11/17/2024 17:04    Procedures Procedures (including critical care time)  Medications Ordered in UC Medications - No data to display  Initial Impression / Assessment and Plan / UC Course  I have reviewed the triage vital signs and the nursing notes.  Pertinent labs & imaging results that were available during my care of the patient were reviewed by me and considered in my medical decision making (see chart for details).     MDM: 1.  Acute URI-Rx'd Augmentin 875/125 mg tablet: Take 1 tablet twice daily x 7 days; 2.  Cough, unspecified type-DG chest 2 view results revealed above patient advised, Rx'd Sterapred Unipak (42 tab 10 mg taper): Take as directed. Advised patient of checks x-ray results with hardcopy and image provided.  Advised patient/daughter take medications as directed with food to completion.  Advised take prednisone with first dose of Augmentin to completion.  Encouraged to increase daily water intake to 64 ounces per day while taking these medications.  Advised if symptoms worsen and/or unresolved please follow-up with your PCP or here for further evaluation.  Discharged home, hemodynamically stable. Final Clinical Impressions(s) / UC Diagnoses   Final diagnoses:  Cough, unspecified type  Acute URI      Discharge Instructions      Advised patient of checks x-ray results with hardcopy and image provided.  Advised patient/daughter take medications as directed with food to completion.  Advised take prednisone with first dose of Augmentin to completion.  Encouraged to increase daily water intake to 64 ounces per day while taking these medications.  Advised if symptoms worsen and/or unresolved please follow-up with your PCP or here for further evaluation.     ED Prescriptions     Medication Sig Dispense Auth. Provider   amoxicillin-clavulanate (AUGMENTIN) 875-125 MG tablet Take 1 tablet by mouth every 12 (twelve) hours. 14 tablet Tawn Fitzner, FNP   predniSONE (STERAPRED UNI-PAK 21 TAB) 10 MG (21) TBPK tablet Take by mouth daily. Take 6 tabs by mouth daily  for 2 days, then 5 tabs for 2 days, then 4 tabs for 2 days, then 3 tabs for 2 days, 2 tabs for 2 days, then 1 tab by mouth daily for 2 days 42 tablet Teddy Sharper, FNP      PDMP not reviewed this encounter.   Teddy Sharper, FNP 11/17/24 1819

## 2024-11-17 NOTE — Discharge Instructions (Addendum)
 Advised patient of checks x-ray results with hardcopy and image provided.  Advised patient/daughter take medications as directed with food to completion.  Advised take prednisone  with first dose of Augmentin  to completion.  Encouraged to increase daily water intake to 64 ounces per day while taking these medications.  Advised if symptoms worsen and/or unresolved please follow-up with your PCP or here for further evaluation.

## 2024-12-08 ENCOUNTER — Ambulatory Visit
Admission: RE | Admit: 2024-12-08 | Discharge: 2024-12-08 | Disposition: A | Discharge status: Home / Self Care | Source: Home / Self Care | Attending: Family Medicine | Admitting: Family Medicine

## 2024-12-08 ENCOUNTER — Other Ambulatory Visit: Payer: Self-pay

## 2024-12-08 ENCOUNTER — Telehealth: Payer: Self-pay | Admitting: Family Medicine

## 2024-12-08 VITALS — BP 150/74 | HR 78 | Temp 98.2°F | Resp 18 | Ht 64.0 in | Wt 105.0 lb

## 2024-12-08 DIAGNOSIS — J209 Acute bronchitis, unspecified: Secondary | ICD-10-CM

## 2024-12-08 DIAGNOSIS — R062 Wheezing: Secondary | ICD-10-CM

## 2024-12-08 MED ORDER — FLUTICASONE-SALMETEROL 115-21 MCG/ACT IN AERO: 2.0000 | INHALATION_SPRAY | Freq: Two times a day (BID) | RESPIRATORY_TRACT | 12 refills | Status: DC

## 2024-12-08 MED ORDER — MOMETASONE FURO-FORMOTEROL FUM 100-5 MCG/ACT IN AERO: 2.0000 | INHALATION_SPRAY | Freq: Two times a day (BID) | RESPIRATORY_TRACT | 0 refills | Status: DC

## 2024-12-08 MED ORDER — PREDNISONE 20 MG PO TABS: 40.0000 mg | ORAL_TABLET | Freq: Every day | ORAL | 0 refills | Status: DC

## 2024-12-08 MED ORDER — AZITHROMYCIN 250 MG PO TABS: ORAL_TABLET | ORAL | 0 refills | Status: DC

## 2024-12-08 MED ORDER — IPRATROPIUM-ALBUTEROL 0.5-2.5 (3) MG/3ML IN SOLN
3.0000 mL | Freq: Once | RESPIRATORY_TRACT | Status: AC
  Administered 2024-12-08: 3 mL via RESPIRATORY_TRACT

## 2024-12-08 MED ORDER — BUDESONIDE-FORMOTEROL FUMARATE 80-4.5 MCG/ACT IN AERO: 2.0000 | INHALATION_SPRAY | Freq: Two times a day (BID) | RESPIRATORY_TRACT | 12 refills | Status: AC

## 2024-12-08 MED ORDER — AEROCHAMBER PLUS FLO-VU LARGE MISC: 1.0000 | Freq: Once | 0 refills | Status: AC

## 2024-12-08 NOTE — ED Provider Notes (Signed)
 [facility]    CSN: [id number] Arrival date & time: 12/08/24  1345      History   Chief Complaint Chief Complaint  Patient presents with   Cough    Congestion, short of breath when walking. Was seen there in October , did antibiotics for 7 days and steroids. I felt much better, until Monday. - Entered by patient    HPI Leah Hansen is a 73 y.o. female.   HPI  Patient has had repeated visits for shortness of breath and cough.  She has been smoking much of her life.  States she does not know that she has any asthma or COPD.  Does not go to the doctor.  States there is nothing.  SHe is clearly short of breath in conversation.  He is accompanied by her sister.  Every time she goes off of antibiotics and steroids her symptoms come back.  Is not on any inhalers.  Has never seen a pulmonologist.  Has never had PFTs.  Has never had lung cancer screening.  Past Medical History:  Diagnosis Date   Allergy    Chicken pox    History of German measles    No blood products    per pt/    Patient Active Problem List   Diagnosis Date Noted   Vitamin D  deficiency 01/21/2018   Positive colorectal cancer screening using Cologuard test 01/08/2018   Elevated hemoglobin A1c 12/14/2017   Tobacco use disorder 12/11/2017   Estrogen deficiency 12/11/2017   Elevated blood pressure reading 12/11/2017    Past Surgical History:  Procedure Laterality Date   AUGMENTATION MAMMAPLASTY Bilateral    saline, behind muscle    LASIK     TUBAL LIGATION      OB History     Gravida  3   Para  3   Term      Preterm      AB      Living  3      SAB      IAB      Ectopic      Multiple      Live Births               Home Medications    Prior to Admission medications  Medication Sig Start Date End Date Taking? Authorizing Provider  azithromycin (ZITHROMAX Z-PAK) 250 MG tablet Take two pills today followed by one a day until gone 12/08/24  Yes Maranda Jamee Jacob, MD  mometasone-formoterol [facility]) 100-5 MCG/ACT AERO Inhale 2 puffs into the lungs in the morning and at bedtime. 12/08/24  Yes Maranda Jamee Jacob, MD  predniSONE  (DELTASONE ) 20 MG tablet Take 2 tablets (40 mg total) by mouth daily with breakfast. 12/08/24  Yes Maranda Jamee Jacob, MD  Spacer/Aero-Holding Chambers (AEROCHAMBER PLUS FLO-VU LARGE) MISC 1 each by Other route once for 1 dose. 12/08/24 12/08/24 Yes Maranda Jamee Jacob, MD  Ascorbic Acid (VITAMIN C) 1000 MG tablet Take 1,000 mg by mouth daily.    [provider]  Calcium Carbonate-Vit D-Min (CALCIUM 600+D PLUS MINERALS) 600-400 MG-UNIT TABS Take 1 tablet by mouth daily.    [provider]  Coenzyme Q10 (COQ-10) 100 MG CAPS Take 200 mg by mouth daily.     [provider]  Glucosamine-Chondroitin 250-200 MG TABS Take 1,000 mg by mouth. Take 2 daily    [provider]  LUTEIN PO Take 1 capsule by mouth daily.    [provider]  NON FORMULARY Protein Drink 60 gm-Drink one daily    [provider]  selenium 50 MCG TABS tablet Take 100 mcg by mouth daily.     [provider]  TURMERIC PO Take 2 tablets by mouth daily.    [provider]    Family History Family History  Problem Relation Age of Onset   Asthma Mother    COPD Mother    Hearing loss Mother    Hyperlipidemia Mother    Hypertension Mother    Alcohol abuse Father    Arthritis Father    Early death Father    Heart disease Father    Hyperlipidemia Father    Hypertension Father    Alcohol abuse Brother    Lung cancer Brother    Depression Brother    Drug abuse Brother    Early death Brother    Arthritis Paternal Grandmother    Lymphoma Paternal Grandmother    COPD Paternal Grandfather    Alcohol abuse Brother    Drug abuse Brother    Early death Brother    Alcohol abuse Son    COPD Son    Drug abuse Son    Colon cancer Neg Hx     Social History Social History[1]   Allergies    Patient has no known allergies.   Review of Systems Review of Systems See HPI  Physical Exam Triage Vital Signs ED Triage Vitals  Encounter Vitals Group     BP 12/08/24 1417 (!) 150/74     Girls Systolic BP Percentile --      Girls Diastolic BP Percentile --      Boys Systolic BP Percentile --      Boys Diastolic BP Percentile --      Pulse Rate 12/08/24 1417 78     Resp 12/08/24 1417 18     Temp 12/08/24 1417 98.2 F (36.8 C)     Temp Source 12/08/24 1417 Oral     SpO2 12/08/24 1417 95 %     Weight 12/08/24 1419 105 lb (47.6 kg)     Height 12/08/24 1419 5' 4 (1.626 m)     Head Circumference --      Peak Flow --      Pain Score 12/08/24 1419 0     Pain Loc --      Pain Education --      Exclude from Growth Chart --    No data found.  Updated Vital Signs BP (!) 150/74 (BP Location: Right Arm)   Pulse 78   Temp 98.2 F (36.8 C) (Oral)   Resp 18   Ht 5' 4 (1.626 m)   Wt 47.6 kg   SpO2 95%   BMI 18.02 kg/m   Physical Exam Constitutional:      General: She is not in acute distress.    Appearance: She is well-developed.     Comments: Patient appears frail and underweight  HENT:     Head: Normocephalic and atraumatic.     Right Ear: Tympanic membrane normal.     Left Ear: Tympanic membrane normal.     Nose: Nose normal.     Mouth/Throat:     Pharynx: No posterior oropharyngeal erythema.     Comments: Edentulous Eyes:     Conjunctiva/sclera: Conjunctivae normal.     Pupils: Pupils are equal, round, and reactive to light.  Cardiovascular:     Rate and Rhythm: Normal rate and regular rhythm.     Heart  sounds: Normal heart sounds.  Pulmonary:     Effort: Pulmonary effort is normal. No respiratory distress.     Breath sounds: Wheezing present.     Comments: Dense wheezing throughout both lungs on inspiration.  After DuoNeb treatment this improved only heard few scattered wheeze.  No rales or rhonchi Abdominal:     General: There is no distension.      Palpations: Abdomen is soft.  Musculoskeletal:        General: Normal range of motion.     Cervical back: Normal range of motion.     Comments: Spine with kyphosis and scoliosis presumably osteoporosis  Lymphadenopathy:     Cervical: No cervical adenopathy.  Skin:    General: Skin is warm and dry.  Neurological:     Mental Status: She is alert.      UC Treatments / Results  Labs (all labs ordered are listed, but only abnormal results are displayed) Labs Reviewed - No data to display  EKG   Radiology No results found.  Procedures Procedures (including critical care time)  Medications Ordered in UC Medications  ipratropium-albuterol (DUONEB) 0.5-2.5 (3) MG/3ML nebulizer solution 3 mL (3 mLs Nebulization Given 12/08/24 1454)    Initial Impression / Assessment and Plan / UC Course  I have reviewed the triage vital signs and the nursing notes.  Pertinent labs & imaging results that were available during my care of the patient were reviewed by me and considered in my medical decision making (see chart for details).     Encouraged follow-up with her primary care doctor for preventative medicine Will treat the acute bronchitis with antibiotics and steroids Will start her on a inhaled COPD medicine, Dulera, to see if this helps prevent recurrence Final Clinical Impressions(s) / UC Diagnoses   Final diagnoses:  Acute bronchitis, unspecified organism  Wheezing     Discharge Instructions      Take the Z-Pak as directed.  This is an antibiotic.  Take 2 today then 1 a day until gone Take the prednisone  once a day for 5 days Start the Dulera inhaler tonight.  Use twice a day.  Continue this every day until you see your primary care doctor. Call for problems   ED Prescriptions     Medication Sig Dispense Auth. Provider   mometasone-formoterol (DULERA) 100-5 MCG/ACT AERO Inhale 2 puffs into the lungs in the morning and at bedtime. 1 each Maranda Jamee Jacob, MD    predniSONE  (DELTASONE ) 20 MG tablet Take 2 tablets (40 mg total) by mouth daily with breakfast. 10 tablet Maranda Jamee Jacob, MD   azithromycin (ZITHROMAX Z-PAK) 250 MG tablet Take two pills today followed by one a day until gone 6 tablet Maranda Jamee Jacob, MD   Spacer/Aero-Holding Chambers (AEROCHAMBER PLUS FLO-VU LARGE) MISC 1 each by Other route once for 1 dose. 1 each Maranda Jamee Jacob, MD      PDMP not reviewed this encounter.    [1]  Social History Tobacco Use   Smoking status: Every Day    Current packs/day: 0.75    Average packs/day: 0.8 packs/day for 40.0 years (30.0 ttl pk-yrs)    Types: Cigarettes   Smokeless tobacco: Never  Vaping Use   Vaping status: Never Used  Substance Use Topics   Alcohol use: No   Drug use: No     Maranda Jamee Jacob, MD 12/08/24 1535

## 2024-12-08 NOTE — Telephone Encounter (Signed)
 Patient was prescribed Dulera based on my computer report of what was covered under her insurance.  I received a call from the pharmacy that they are unable to provide Dulera.  The pharmacist asked if I would send prescriptions for Symbicort and Advair and they would try to get approval for 1 of those.  It looks like she got 3 inhalers sent, but the pharmacist is only going to fill 1

## 2024-12-08 NOTE — Discharge Instructions (Signed)
 Take the Z-Pak as directed.  This is an antibiotic.  Take 2 today then 1 a day until gone Take the prednisone  once a day for 5 days Start the Dulera inhaler tonight.  Use twice a day.  Continue this every day until you see your primary care doctor. Call for problems

## 2024-12-08 NOTE — ED Triage Notes (Signed)
 Pt presenting with c/o SOB on exertion and sometimes at night, cough ,chest and head congestion x1 month. Pt stated that she completed ABT and Prednisone  on 11/29/24. No medication being taken for symptoms at this time.

## 2025-01-03 ENCOUNTER — Ambulatory Visit: Payer: Self-pay | Admitting: Family Medicine

## 2025-01-03 ENCOUNTER — Encounter: Payer: Self-pay | Admitting: Family Medicine

## 2025-01-03 ENCOUNTER — Ambulatory Visit: Admitting: Family Medicine

## 2025-01-03 VITALS — BP 132/80 | HR 66 | Temp 98.3°F | Ht 64.0 in | Wt 106.6 lb

## 2025-01-03 DIAGNOSIS — F17200 Nicotine dependence, unspecified, uncomplicated: Secondary | ICD-10-CM | POA: Diagnosis not present

## 2025-01-03 DIAGNOSIS — R9389 Abnormal findings on diagnostic imaging of other specified body structures: Secondary | ICD-10-CM

## 2025-01-03 DIAGNOSIS — R911 Solitary pulmonary nodule: Secondary | ICD-10-CM

## 2025-01-03 DIAGNOSIS — J439 Emphysema, unspecified: Secondary | ICD-10-CM

## 2025-01-03 DIAGNOSIS — R052 Subacute cough: Secondary | ICD-10-CM | POA: Insufficient documentation

## 2025-01-03 DIAGNOSIS — Z681 Body mass index (BMI) 19 or less, adult: Secondary | ICD-10-CM | POA: Diagnosis not present

## 2025-01-03 DIAGNOSIS — K635 Polyp of colon: Secondary | ICD-10-CM | POA: Insufficient documentation

## 2025-01-03 DIAGNOSIS — R636 Underweight: Secondary | ICD-10-CM

## 2025-01-03 DIAGNOSIS — J40 Bronchitis, not specified as acute or chronic: Secondary | ICD-10-CM | POA: Insufficient documentation

## 2025-01-03 DIAGNOSIS — R918 Other nonspecific abnormal finding of lung field: Secondary | ICD-10-CM

## 2025-01-03 DIAGNOSIS — K838 Other specified diseases of biliary tract: Secondary | ICD-10-CM

## 2025-01-03 DIAGNOSIS — I700 Atherosclerosis of aorta: Secondary | ICD-10-CM

## 2025-01-03 LAB — CBC WITH DIFFERENTIAL/PLATELET
Basophils Absolute: 0.1 K/uL (ref 0.0–0.1)
Basophils Relative: 1.6 % (ref 0.0–3.0)
Eosinophils Absolute: 0.1 K/uL (ref 0.0–0.7)
Eosinophils Relative: 1.6 % (ref 0.0–5.0)
HCT: 43.4 % (ref 36.0–46.0)
Hemoglobin: 14.6 g/dL (ref 12.0–15.0)
Lymphocytes Relative: 21.1 % (ref 12.0–46.0)
Lymphs Abs: 1 K/uL (ref 0.7–4.0)
MCHC: 33.5 g/dL (ref 30.0–36.0)
MCV: 93.6 fl (ref 78.0–100.0)
Monocytes Absolute: 0.8 K/uL (ref 0.1–1.0)
Monocytes Relative: 15.7 % — ABNORMAL HIGH (ref 3.0–12.0)
Neutro Abs: 2.9 K/uL (ref 1.4–7.7)
Neutrophils Relative %: 60 % (ref 43.0–77.0)
Platelets: 238 K/uL (ref 150.0–400.0)
RBC: 4.64 Mil/uL (ref 3.87–5.11)
RDW: 15.1 % (ref 11.5–15.5)
WBC: 4.9 K/uL (ref 4.0–10.5)

## 2025-01-03 LAB — COMPREHENSIVE METABOLIC PANEL WITH GFR
ALT: 21 U/L (ref 3–35)
AST: 22 U/L (ref 5–37)
Albumin: 4.2 g/dL (ref 3.5–5.2)
Alkaline Phosphatase: 52 U/L (ref 39–117)
BUN: 19 mg/dL (ref 6–23)
CO2: 34 meq/L — ABNORMAL HIGH (ref 19–32)
Calcium: 9.2 mg/dL (ref 8.4–10.5)
Chloride: 102 meq/L (ref 96–112)
Creatinine, Ser: 0.73 mg/dL (ref 0.40–1.20)
GFR: 81.74 mL/min
Glucose, Bld: 88 mg/dL (ref 70–99)
Potassium: 4.8 meq/L (ref 3.5–5.1)
Sodium: 140 meq/L (ref 135–145)
Total Bilirubin: 0.4 mg/dL (ref 0.2–1.2)
Total Protein: 6.5 g/dL (ref 6.0–8.3)

## 2025-01-03 MED ORDER — ALBUTEROL SULFATE HFA 108 (90 BASE) MCG/ACT IN AERS: 2.0000 | INHALATION_SPRAY | Freq: Four times a day (QID) | RESPIRATORY_TRACT | 0 refills | Status: AC | PRN

## 2025-01-03 NOTE — Progress Notes (Signed)
 "    Patient ID: Leah Hansen, female  DOB: [birth year], 73 y.o.   MRN: [id number] Patient Care Team    Relationship Specialty Notifications Start End  Catherine Charlies DELENA, DO PCP - General Family Medicine  01/03/25     Chief Complaint  Patient presents with   Establish Care   Shortness of Breath    Ongoing since November. Associated w/ Head/chest congestion. Prescribed Zpack, prednisone , Doxy. Chest X-ray done 11/17/24.     Subjective:  Leah Hansen is a 74 y.o.  female present for new patient RE-establishment, last seen over 7 yrs ago.  All past medical history, surgical history, allergies, family history, immunizations, medications and social history were updated in the electronic medical record today. All recent labs, ED visits and hospitalizations within the last year were reviewed.  Patient presents today with her daughter, to reestablish care and discuss her bronchitis.  Patient was seen October 3 for the start of bronchitis which she was severely short of breath.  She was evaluated again 11/20 and 12/11 for persistent symptoms.  During this time she had a chest x-ray 11/17/2024, results below.  No labs were collected.  She is an everyday smoker. She was treated with Z-Pak, 2 rounds of prednisone , doxycycline and Augmentin  over this time. She was started on Symbicort  80-4.5, 2 puffs twice daily- 3 weeks ago.  She reports since starting the inhaler she has noticed her breathing has greatly improved.  She still is coughing up a small amount of sputum daily and frequently throughout the day.  She denies any fevers or chills.  Cxr 11/17/2024: FINDINGS: No focal consolidation, pleural effusion or pneumothorax. The cardiac silhouette is within limits. No acute osseous pathology. Scoliosis. Bilateral breast implants. IMPRESSION: No active cardiopulmonary disease.     01/03/2025    8:48 AM 12/11/2017   10:07 AM  Depression screen PHQ 2/9  Decreased Interest 0 0  Down,  Depressed, Hopeless 0 0  PHQ - 2 Score 0 0  Altered sleeping 0   Tired, decreased energy 0   Change in appetite 0   Feeling bad or failure about yourself  0   Trouble concentrating 0   Moving slowly or fidgety/restless 0   Suicidal thoughts 0   PHQ-9 Score 0   Difficult doing work/chores Not difficult at all       01/03/2025    8:48 AM  GAD 7 : Generalized Anxiety Score  Nervous, Anxious, on Edge 0  Control/stop worrying 0  Worry too much - different things 0  Trouble relaxing 0  Restless 0  Easily annoyed or irritable 0  Afraid - awful might happen 0  Total GAD 7 Score 0  Anxiety Difficulty Not difficult at all          01/03/2025    8:48 AM 12/11/2017   10:07 AM  Fall Risk   Falls in the past year? 0 No   Number falls in past yr: 0   Injury with Fall? 0   Risk for fall due to : No Fall Risks Other (Comment)   Follow up Falls evaluation completed      Data saved with a previous flowsheet row definition    Immunization History  Administered Date(s) Administered   Influenza-Unspecified 11/03/2017   Pneumococcal Conjugate-13 01/06/2018    No results found.  Past Medical History:  Diagnosis Date   Allergy    Chicken pox    History of German measles    No blood  products    per pt/   Allergies[1] Past Surgical History:  Procedure Laterality Date   AUGMENTATION MAMMAPLASTY Bilateral    saline, behind muscle    LASIK     TUBAL LIGATION     Family History  Problem Relation Age of Onset   Asthma Mother    COPD Mother    Hearing loss Mother    Hyperlipidemia Mother    Hypertension Mother    Alcohol abuse Father    Arthritis Father    Early death Father    Heart disease Father    Hyperlipidemia Father    Hypertension Father    Alcohol abuse Brother    Lung cancer Brother    Depression Brother    Drug abuse Brother    Early death Brother    Arthritis Paternal Grandmother    Lymphoma Paternal Grandmother    COPD Paternal Grandfather    Alcohol  abuse Brother    Drug abuse Brother    Early death Brother    Alcohol abuse Son    COPD Son    Drug abuse Son    Colon cancer Neg Hx    Social History   Social History Narrative   Divorced, college educated, 3 children.   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   Current smoker.   Exercises routinely.   Takes a daily vitamin.   Drinks caffeine.   Has a smoke alarm in the home, wears her seatbelt.   Wears dentures.   Feels safe in her relationships.    Allergies as of 01/03/2025   No Known Allergies      Medication List        Accurate as of January 03, 2025  2:04 PM. If you have any questions, ask your nurse or doctor.          STOP taking these medications    azithromycin  250 MG tablet Commonly known as: Zithromax  Z-Pak Stopped by: Charlies Bellini, DO   fluticasone -salmeterol 115-21 MCG/ACT inhaler Commonly known as: Advair HFA Stopped by: Charlies Bellini, DO   mometasone -formoterol  100-5 MCG/ACT Aero Commonly known as: DULERA Stopped by: Charlies Bellini, DO   NON FORMULARY Stopped by: Charlies Bellini, DO   predniSONE  20 MG tablet Commonly known as: DELTASONE  Stopped by: Charlies Bellini, DO       TAKE these medications    albuterol  108 (90 Base) MCG/ACT inhaler Commonly known as: VENTOLIN  HFA Inhale 2 puffs into the lungs every 6 (six) hours as needed for wheezing or shortness of breath. Started by: Charlies Bellini, DO   budesonide -formoterol  80-4.5 MCG/ACT inhaler Commonly known as: Symbicort  Inhale 2 puffs into the lungs in the morning and at bedtime.   Calcium 600+D Plus Minerals 600-400 MG-UNIT Tabs Take 1 tablet by mouth daily.   CoQ-10 100 MG Caps Take 200 mg by mouth daily.   Glucosamine-Chondroitin 250-200 MG Tabs Take 1,000 mg by mouth. Take 2 daily   LUTEIN PO Take 1 capsule by mouth daily.   selenium 50 MCG Tabs tablet Take 100 mcg by mouth daily.   TURMERIC PO Take 2 tablets by mouth daily.   vitamin C 1000 MG  tablet Take 1,000 mg by mouth daily.        All past medical history, surgical history, allergies, family history, immunizations andmedications were updated in the EMR today and reviewed under the history and medication portions of their EMR.    No results found for this or any previous visit (  from the past 2160 hours).  No results found.   ROS 14 pt review of systems performed and negative (unless mentioned in an HPI)  Objective: BP 132/80   Pulse 66   Temp 98.3 F (36.8 C)   Ht 5' 4 (1.626 m)   Wt 106 lb 9.6 oz (48.4 kg)   SpO2 95%   BMI 18.30 kg/m  Physical Exam Vitals and nursing note reviewed.  Constitutional:      General: She is not in acute distress.    Appearance: Normal appearance. She is not ill-appearing, toxic-appearing or diaphoretic.     Comments: thin  HENT:     Head: Normocephalic and atraumatic.     Right Ear: External ear normal. There is impacted cerumen.     Left Ear: Tympanic membrane, ear canal and external ear normal.     Nose: No congestion or rhinorrhea.     Mouth/Throat:     Mouth: Mucous membranes are moist.     Pharynx: No posterior oropharyngeal erythema.     Comments: PND present Eyes:     General: No scleral icterus.       Right eye: No discharge.        Left eye: No discharge.     Extraocular Movements: Extraocular movements intact.     Conjunctiva/sclera: Conjunctivae normal.     Pupils: Pupils are equal, round, and reactive to light.  Cardiovascular:     Rate and Rhythm: Normal rate and regular rhythm.     Heart sounds: No murmur heard. Pulmonary:     Effort: Pulmonary effort is normal. No respiratory distress.     Breath sounds: Normal breath sounds. No wheezing, rhonchi or rales.  Musculoskeletal:     Cervical back: Neck supple. No tenderness.     Right lower leg: No edema.     Left lower leg: No edema.  Lymphadenopathy:     Cervical: No cervical adenopathy.  Skin:    General: Skin is warm.     Findings: No rash.   Neurological:     Mental Status: She is alert and oriented to person, place, and time. Mental status is at baseline.     Motor: No weakness.     Gait: Gait normal.  Psychiatric:        Mood and Affect: Mood normal.        Behavior: Behavior normal.        Thought Content: Thought content normal.        Judgment: Judgment normal.      Assessment/plan: Leah Hansen is a 74 y.o. female present for reestablishment care Bronchitis (Primary)/Subacute cough/BMI < 18/tobacco use disorder Persistent bronchitis symptoms greater than 12 weeks, despite multiple antibiotic regimens and 2 rounds of prednisone  at the urgent cares, tobacco use disorder-current smoker greater than 40-pack-year history - CBC w/Diff - Comp Met (CMET) - CT Chest W Contrast; Future -Patient not ready to quit smoking - Continue Symbicort  twice daily -Albuterol  inhaler prescribed as needed with instructions on proper use.  Patient will be called with results of labs and CT, further plan and follow-up will be discussed at that time.  Patient was encouraged to schedule yearly physical.  Her daughter reports she will make sure that happens.  Orders Placed This Encounter  Procedures   CT Chest W Contrast   CBC w/Diff   Comp Met (CMET)   Meds ordered this encounter  Medications   albuterol  (VENTOLIN  HFA) 108 (90 Base) MCG/ACT inhaler  Sig: Inhale 2 puffs into the lungs every 6 (six) hours as needed for wheezing or shortness of breath.    Dispense:  8 g    Refill:  0   Referral Orders  No referral(s) requested today     Note is dictated utilizing voice recognition software. Although note has been proof read prior to signing, occasional typographical errors still can be missed. If any questions arise, please do not hesitate to call for verification.  Electronically signed by: Charlies Bellini, DO [facility]     [1] No Known Allergies  "

## 2025-01-04 NOTE — Telephone Encounter (Signed)
"  No further action needed at this time.  "

## 2025-01-17 ENCOUNTER — Telehealth: Payer: Self-pay

## 2025-01-17 NOTE — Telephone Encounter (Signed)
 MyChart message reminder sent regarding mammogram.

## 2025-02-03 ENCOUNTER — Ambulatory Visit (HOSPITAL_BASED_OUTPATIENT_CLINIC_OR_DEPARTMENT_OTHER): Admission: RE | Admit: 2025-02-03 | Source: Ambulatory Visit

## 2025-02-03 DIAGNOSIS — R9389 Abnormal findings on diagnostic imaging of other specified body structures: Secondary | ICD-10-CM | POA: Insufficient documentation

## 2025-02-03 DIAGNOSIS — J439 Emphysema, unspecified: Secondary | ICD-10-CM | POA: Insufficient documentation

## 2025-02-03 DIAGNOSIS — R918 Other nonspecific abnormal finding of lung field: Secondary | ICD-10-CM | POA: Insufficient documentation

## 2025-02-03 DIAGNOSIS — J40 Bronchitis, not specified as acute or chronic: Secondary | ICD-10-CM

## 2025-02-03 DIAGNOSIS — I700 Atherosclerosis of aorta: Secondary | ICD-10-CM | POA: Insufficient documentation

## 2025-02-03 DIAGNOSIS — R052 Subacute cough: Secondary | ICD-10-CM

## 2025-02-03 DIAGNOSIS — R911 Solitary pulmonary nodule: Secondary | ICD-10-CM | POA: Insufficient documentation

## 2025-02-03 DIAGNOSIS — K838 Other specified diseases of biliary tract: Secondary | ICD-10-CM | POA: Insufficient documentation

## 2025-02-03 DIAGNOSIS — F17200 Nicotine dependence, unspecified, uncomplicated: Secondary | ICD-10-CM

## 2025-02-03 MED ORDER — IOHEXOL 300 MG/ML  SOLN
75.0000 mL | Freq: Once | INTRAMUSCULAR | Status: AC | PRN
  Administered 2025-02-03: 75 mL via INTRAVENOUS

## 2025-02-03 NOTE — Telephone Encounter (Signed)
 Please call patient concerning her chest CT results. Have reviewed different findings we need to review the lower part together on her CT. I would like to have her schedule an appointment ASAP early next week to discuss these findings in detail with patient and allow her to ask questions. Please schedule her for this, again ASAP can use any slot. Briefly, she has signs of emphysema, which we suspected.  She also has quite a few different types of lesions throughout her lung that we need to further evaluate and discuss.  She also has an enlarged duct that comes off of her liver/gallbladder area, that we will need to discuss and get further imaging to better understand cause.  Again, with the multiple different types of findings, I believe it is best if we discussed these in person in detail. I did go ahead and place a referral to pulmonology, which her lung doctors, which will contact her to get her scheduled as soon as possible as well.  They would be the specialist team to further evaluate the findings in the lungs.  I also placed a referral to gastroenterology-prior patient of Dr. Albertus, to further evaluate the common bile duct finding.  They will also be reaching out to her to get her scheduled.

## 2025-02-06 ENCOUNTER — Ambulatory Visit: Admitting: Family Medicine
